# Patient Record
Sex: Female | Born: 1950 | Race: White | Hispanic: No | Marital: Married | Smoking: Never smoker
Health system: Southern US, Community
[De-identification: ages and names within clinical notes are randomized; demographics above are authoritative.]

---

## 2011-08-18 ENCOUNTER — Other Ambulatory Visit: Payer: Self-pay | Admitting: Family Medicine

## 2011-08-18 DIAGNOSIS — Z1231 Encounter for screening mammogram for malignant neoplasm of breast: Secondary | ICD-10-CM

## 2011-09-28 ENCOUNTER — Ambulatory Visit
Admission: RE | Admit: 2011-09-28 | Discharge: 2011-09-28 | Disposition: A | Discharge status: Home / Self Care | Payer: BC Managed Care – PPO | Source: Ambulatory Visit | Attending: Family Medicine | Admitting: Family Medicine

## 2011-09-28 DIAGNOSIS — Z1231 Encounter for screening mammogram for malignant neoplasm of breast: Secondary | ICD-10-CM

## 2012-12-28 ENCOUNTER — Other Ambulatory Visit: Payer: Self-pay

## 2012-12-28 DIAGNOSIS — Z1231 Encounter for screening mammogram for malignant neoplasm of breast: Secondary | ICD-10-CM

## 2013-01-26 ENCOUNTER — Ambulatory Visit
Admission: RE | Admit: 2013-01-26 | Discharge: 2013-01-26 | Disposition: A | Discharge status: Home / Self Care | Payer: BC Managed Care – PPO | Source: Ambulatory Visit

## 2013-01-26 DIAGNOSIS — Z1231 Encounter for screening mammogram for malignant neoplasm of breast: Secondary | ICD-10-CM

## 2014-01-22 ENCOUNTER — Other Ambulatory Visit: Payer: Self-pay

## 2014-01-22 DIAGNOSIS — Z1239 Encounter for other screening for malignant neoplasm of breast: Secondary | ICD-10-CM

## 2014-01-29 ENCOUNTER — Other Ambulatory Visit: Payer: Self-pay

## 2014-01-29 DIAGNOSIS — Z1231 Encounter for screening mammogram for malignant neoplasm of breast: Secondary | ICD-10-CM

## 2014-01-29 DIAGNOSIS — Z1239 Encounter for other screening for malignant neoplasm of breast: Secondary | ICD-10-CM

## 2014-01-30 ENCOUNTER — Ambulatory Visit
Admission: RE | Admit: 2014-01-30 | Discharge: 2014-01-30 | Disposition: A | Discharge status: Home / Self Care | Payer: BC Managed Care – PPO | Source: Ambulatory Visit

## 2014-01-30 DIAGNOSIS — Z1239 Encounter for other screening for malignant neoplasm of breast: Secondary | ICD-10-CM

## 2014-01-30 DIAGNOSIS — Z1231 Encounter for screening mammogram for malignant neoplasm of breast: Secondary | ICD-10-CM

## 2014-02-01 ENCOUNTER — Ambulatory Visit: Payer: BC Managed Care – PPO

## 2014-02-04 ENCOUNTER — Other Ambulatory Visit: Payer: Self-pay | Admitting: Family Medicine

## 2014-02-04 DIAGNOSIS — R928 Other abnormal and inconclusive findings on diagnostic imaging of breast: Secondary | ICD-10-CM

## 2014-02-06 ENCOUNTER — Ambulatory Visit
Admission: RE | Admit: 2014-02-06 | Discharge: 2014-02-06 | Disposition: A | Discharge status: Home / Self Care | Payer: BC Managed Care – PPO | Source: Ambulatory Visit | Attending: Family Medicine | Admitting: Family Medicine

## 2014-02-06 DIAGNOSIS — R928 Other abnormal and inconclusive findings on diagnostic imaging of breast: Secondary | ICD-10-CM

## 2014-06-05 ENCOUNTER — Other Ambulatory Visit: Payer: Self-pay | Admitting: Family Medicine

## 2014-06-05 ENCOUNTER — Ambulatory Visit
Admission: RE | Admit: 2014-06-05 | Discharge: 2014-06-05 | Disposition: A | Discharge status: Home / Self Care | Payer: BLUE CROSS/BLUE SHIELD | Source: Ambulatory Visit | Attending: Family Medicine | Admitting: Family Medicine

## 2014-06-05 DIAGNOSIS — R05 Cough: Secondary | ICD-10-CM

## 2014-06-05 DIAGNOSIS — R059 Cough, unspecified: Secondary | ICD-10-CM

## 2015-01-21 ENCOUNTER — Other Ambulatory Visit: Payer: Self-pay

## 2015-01-21 DIAGNOSIS — Z1231 Encounter for screening mammogram for malignant neoplasm of breast: Secondary | ICD-10-CM

## 2015-02-21 ENCOUNTER — Ambulatory Visit: Payer: BLUE CROSS/BLUE SHIELD

## 2015-04-18 ENCOUNTER — Ambulatory Visit
Admission: RE | Admit: 2015-04-18 | Discharge: 2015-04-18 | Disposition: A | Discharge status: Home / Self Care | Payer: BLUE CROSS/BLUE SHIELD | Source: Ambulatory Visit

## 2015-04-18 DIAGNOSIS — Z1231 Encounter for screening mammogram for malignant neoplasm of breast: Secondary | ICD-10-CM

## 2015-05-02 ENCOUNTER — Ambulatory Visit: Payer: BLUE CROSS/BLUE SHIELD

## 2015-05-02 ENCOUNTER — Encounter: Payer: Self-pay | Admitting: Podiatry

## 2015-05-02 ENCOUNTER — Ambulatory Visit (INDEPENDENT_AMBULATORY_CARE_PROVIDER_SITE_OTHER): Payer: BLUE CROSS/BLUE SHIELD

## 2015-05-02 ENCOUNTER — Ambulatory Visit (INDEPENDENT_AMBULATORY_CARE_PROVIDER_SITE_OTHER): Payer: BLUE CROSS/BLUE SHIELD | Admitting: Podiatry

## 2015-05-02 VITALS — BP 133/87 | HR 85 | Resp 16 | Ht 60.0 in | Wt 140.0 lb

## 2015-05-02 DIAGNOSIS — M21619 Bunion of unspecified foot: Secondary | ICD-10-CM

## 2015-05-02 DIAGNOSIS — Q6622 Congenital metatarsus adductus: Secondary | ICD-10-CM | POA: Diagnosis not present

## 2015-05-02 DIAGNOSIS — Q66229 Congenital metatarsus adductus, unspecified foot: Secondary | ICD-10-CM

## 2015-05-02 DIAGNOSIS — M2040 Other hammer toe(s) (acquired), unspecified foot: Secondary | ICD-10-CM

## 2015-05-02 NOTE — Progress Notes (Signed)
Subjective:     Patient ID: Haley Cabrera, female   DOB: 1950/07/12, 65 y.o.   MRN: [id number]  HPI patient states I have severe forefoot derangement right and I've had my bunion fixed on the left 8 years ago and it's done fine and the second and third toes on the right are also giving me problems. I like something done but I need to do something where I can hopefully remain weightbearing   Review of Systems  All other systems reviewed and are negative.      Objective:   Physical Exam  Constitutional: She is oriented to person, place, and time.  Cardiovascular: Intact distal pulses.   Musculoskeletal: Normal range of motion.  Neurological: She is oriented to person, place, and time.  Skin: Skin is warm.  Nursing note and vitals reviewed.  neurovascular status found to be intact muscle strength adequate range of motion within normal limits with patient noted to have significant forefoot derangement with large hyperostosis medial aspect first metatarsal head right lateral deviation of the hallux second and third toes and keratotic lesion on top of the second and third toe right with redness and pain. Good digital perfusion and well oriented 3     Assessment:     Severe forefoot derangement right with history of structural bunion correction left and hammertoe repair third left    Plan:     H&P and x-rays of both feet reviewed. At this point I discussed different surgical procedures that could be considered and I have recommended due to the fact she needs to stay weightbearing and Austin-type osteotomy and digital fusion digits 23 right even though this will not correct underlying metatarsus adductus condition. Patient is willing to accept this and understands will not give her complete correction and she'll reappoint for consult in his tenably scheduled for surgery in February. Spent a great of time reviewing what would be necessary

## 2015-05-02 NOTE — Progress Notes (Signed)
   Subjective:    Patient ID: Haley Cabrera, female    DOB: Sep 06, 1950, 65 y.o.   MRN: [id number]  HPI Patient presents with foot pain in their Right foot; bunion. Pt wants to discuss surgery; pt stated, "Had a bunionectomy in [place] on left foot about 65 yrs old".   Review of Systems  All other systems reviewed and are negative.      Objective:   Physical Exam        Assessment & Plan:

## 2015-05-21 ENCOUNTER — Ambulatory Visit (INDEPENDENT_AMBULATORY_CARE_PROVIDER_SITE_OTHER): Payer: BLUE CROSS/BLUE SHIELD | Admitting: Podiatry

## 2015-05-21 DIAGNOSIS — M21619 Bunion of unspecified foot: Secondary | ICD-10-CM | POA: Diagnosis not present

## 2015-05-21 DIAGNOSIS — M2040 Other hammer toe(s) (acquired), unspecified foot: Secondary | ICD-10-CM

## 2015-05-21 NOTE — Patient Instructions (Signed)
Pre-Operative Instructions  Congratulations, you have decided to take an important step to improving your quality of life.  You can be assured that the doctors of [facility] will be with you every step of the way.  1. Plan to be at the surgery center/hospital at least 1 (one) hour prior to your scheduled time unless otherwise directed by the surgical center/hospital staff.  You must have a responsible adult accompany you, remain during the surgery and drive you home.  Make sure you have directions to the surgical center/hospital and know how to get there on time. 2. For hospital based surgery you will need to obtain a history and physical form from your family physician within 1 month prior to the date of surgery- we will give you a form for you primary physician.  3. We make every effort to accommodate the date you request for surgery.  There are however, times where surgery dates or times have to be moved.  We will contact you as soon as possible if a change in schedule is required.   4. No Aspirin/Ibuprofen for one week before surgery.  If you are on aspirin, any non-steroidal anti-inflammatory medications (Mobic, Aleve, Ibuprofen) you should stop taking it 7 days prior to your surgery.  You make take Tylenol  For pain prior to surgery.  5. Medications- If you are taking daily heart and blood pressure medications, seizure, reflux, allergy, asthma, anxiety, pain or diabetes medications, make sure the surgery center/hospital is aware before the day of surgery so they may notify you which medications to take or avoid the day of surgery. 6. No food or drink after midnight the night before surgery unless directed otherwise by surgical center/hospital staff. 7. No alcoholic beverages 24 hours prior to surgery.  No smoking 24 hours prior to or 24 hours after surgery. 8. Wear loose pants or shorts- loose enough to fit over bandages, boots, and casts. 9. No slip on shoes, sneakers are best. 10. Bring  your boot with you to the surgery center/hospital.  Also bring crutches or a walker if your physician has prescribed it for you.  If you do not have this equipment, it will be provided for you after surgery. 11. If you have not been contracted by the surgery center/hospital by the day before your surgery, call to confirm the date and time of your surgery. 12. Leave-time from work may vary depending on the type of surgery you have.  Appropriate arrangements should be made prior to surgery with your employer. 13. Prescriptions will be provided immediately following surgery by your doctor.  Have these filled as soon as possible after surgery and take the medication as directed. 14. Remove nail polish on the operative foot. 15. Wash the night before surgery.  The night before surgery wash the foot and leg well with the antibacterial soap provided and water paying special attention to beneath the toenails and in between the toes.  Rinse thoroughly with water and dry well with a towel.  Perform this wash unless told not to do so by your physician.  Enclosed: 1 Ice pack (please put in freezer the night before surgery)   1 Hibiclens skin cleaner   Pre-op Instructions  If you have any questions regarding the instructions, do not hesitate to call our office.  [place]: [address] [place], [place] [address] [phone]  [place]: [address] [place], [place] [address] [phone]  [place]: [address]  [place],  [address] [phone]  Dr. Richard   Tuchman DPM, Dr. Norman Regal DPM Dr. Richard Sikora DPM, Dr. M. Todd Hyatt DPM, Dr. Kathryn Egerton DPM 

## 2015-05-21 NOTE — Progress Notes (Signed)
Subjective:     Patient ID: Haley Cabrera, female   DOB: 07-17-1950, 65 y.o.   MRN: [id number]  HPI patient presents for consult concerning correction of the right foot and states that she understands there is no guarantees as far as success of procedures. Review of Systems     Objective:   Physical Exam Neurovascular status intact negative Homans sign noted with good digital perfusion noted. 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 We discussed the fact there is also metatarsus adductus deformity and we will not be deemed this portion of the component and she is completely comfortable with this understanding that she may not achieve complete clinical correction or radiographic correction. Understands adductus deformity does make the correction more difficult and that there is no long-term guarantees as far as cosmetic result. Patient is instant getting out of pain and I do think this will achieve that and she needs to be weightbearing due to her job status. This time I allowed her to read consent form reviewing alternative treatments complications as listed and after extensive review she signed understanding recovery is approximate 6 months. She is dispensed air fracture walker with instructions on usage and all preoperative instructions and will be seen back to recheck

## 2015-05-27 ENCOUNTER — Encounter: Payer: Self-pay | Admitting: Podiatry

## 2015-05-27 DIAGNOSIS — M2041 Other hammer toe(s) (acquired), right foot: Secondary | ICD-10-CM | POA: Diagnosis not present

## 2015-05-27 DIAGNOSIS — M2011 Hallux valgus (acquired), right foot: Secondary | ICD-10-CM | POA: Diagnosis not present

## 2015-06-05 ENCOUNTER — Ambulatory Visit (INDEPENDENT_AMBULATORY_CARE_PROVIDER_SITE_OTHER): Payer: BLUE CROSS/BLUE SHIELD | Admitting: Podiatry

## 2015-06-05 ENCOUNTER — Ambulatory Visit (INDEPENDENT_AMBULATORY_CARE_PROVIDER_SITE_OTHER): Payer: BLUE CROSS/BLUE SHIELD

## 2015-06-05 ENCOUNTER — Encounter: Payer: Self-pay | Admitting: Podiatry

## 2015-06-05 VITALS — BP 136/89 | HR 77 | Resp 16

## 2015-06-05 DIAGNOSIS — M21619 Bunion of unspecified foot: Secondary | ICD-10-CM

## 2015-06-05 DIAGNOSIS — M2040 Other hammer toe(s) (acquired), unspecified foot: Secondary | ICD-10-CM

## 2015-06-05 DIAGNOSIS — Z9889 Other specified postprocedural states: Secondary | ICD-10-CM

## 2015-06-05 NOTE — Progress Notes (Signed)
Subjective:     Patient ID: Haley Cabrera, female   DOB: 1950-09-24, 65 y.o.   MRN: [id number]  HPI patient states my foot has felt sore but I'm doing okay overall with not a lot of swelling and I did not take very much pain medicine as it bothers me   Review of Systems     Objective:   Physical Exam Neurovascular status intact muscle strength adequate range of motion within normal limits with patient found to have well coapted site second and third digits right with pins in place and first metatarsal right with some shortening noted of the hallux clinically area negative Homans sign was noted    Assessment:     Healing from forefoot surgery right    Plan:     X-ray taken reviewed. There has been unfortunately some movement of the big toe in a medial and dorsal direction indicating a probable tear of the lateral capsule allowing for instability. She may have been on it some or it may have been a spontaneous event but no matter what it's going to have to be repositioned. I reviewed this case with Dr. Al Corpus and reviewed x-rays and we are   both in agreement that the hallux can be re-positioned over top the first metatarsal with pin fixation and that it's possible that we will need to realign the head itself. I allowed her to read a consent form for this and she is scheduled for surgery in the next week to have this realigned. Understands that it may not necessarily to this position and that the possibility does exist that it could require fusion at one point in the future  X-ray report indicated that there is dorsal and medial movement of the right hallux and possible movement of the first metatarsal head itself with the second and third toes been excellent position and pins in place

## 2015-06-10 ENCOUNTER — Encounter: Payer: Self-pay | Admitting: Podiatry

## 2015-06-10 DIAGNOSIS — S92301S Fracture of unspecified metatarsal bone(s), right foot, sequela: Secondary | ICD-10-CM | POA: Diagnosis not present

## 2015-06-18 ENCOUNTER — Ambulatory Visit (INDEPENDENT_AMBULATORY_CARE_PROVIDER_SITE_OTHER): Payer: BLUE CROSS/BLUE SHIELD

## 2015-06-18 ENCOUNTER — Ambulatory Visit (INDEPENDENT_AMBULATORY_CARE_PROVIDER_SITE_OTHER): Payer: BLUE CROSS/BLUE SHIELD | Admitting: Podiatry

## 2015-06-18 VITALS — Temp 98.5°F

## 2015-06-18 DIAGNOSIS — Z9889 Other specified postprocedural states: Secondary | ICD-10-CM

## 2015-06-18 DIAGNOSIS — M2040 Other hammer toe(s) (acquired), unspecified foot: Secondary | ICD-10-CM | POA: Diagnosis not present

## 2015-06-18 DIAGNOSIS — M21619 Bunion of unspecified foot: Secondary | ICD-10-CM | POA: Diagnosis not present

## 2015-06-19 NOTE — Progress Notes (Signed)
Subjective:     Patient ID: Haley Cabrera, female   DOB: [birth year], 65 y.o.   MRN: [id number]  HPI patient states she's doing very well with her right foot and is having minimal discomfort   Review of Systems     Objective:   Physical Exam Vascular status intact muscle strength adequate with pin in place right big toe second and third toe with excellent alignment of the right big toe noted currently    Assessment:     Doing well post repair of abnormal bone position right first metatarsal head    Plan:     X-rays reviewed and continue immobilization at the current time. Reappoint in 2 weeks for pin removal second and third digits and the big toe will be in place for approximate 3 weeks  X-ray report indicate excellent alignment of the hallux second and third toes with the osteotomy healing well and in good position with joint congruence

## 2015-06-30 ENCOUNTER — Encounter: Payer: Self-pay | Admitting: Podiatry

## 2015-06-30 ENCOUNTER — Ambulatory Visit (INDEPENDENT_AMBULATORY_CARE_PROVIDER_SITE_OTHER): Payer: BLUE CROSS/BLUE SHIELD | Admitting: Podiatry

## 2015-06-30 ENCOUNTER — Ambulatory Visit (INDEPENDENT_AMBULATORY_CARE_PROVIDER_SITE_OTHER): Payer: BLUE CROSS/BLUE SHIELD

## 2015-06-30 VITALS — BP 124/88 | HR 98 | Resp 16

## 2015-06-30 DIAGNOSIS — M2040 Other hammer toe(s) (acquired), unspecified foot: Secondary | ICD-10-CM

## 2015-06-30 DIAGNOSIS — M21619 Bunion of unspecified foot: Secondary | ICD-10-CM

## 2015-06-30 DIAGNOSIS — Z9889 Other specified postprocedural states: Secondary | ICD-10-CM | POA: Diagnosis not present

## 2015-07-01 ENCOUNTER — Encounter: Payer: Self-pay | Admitting: Podiatry

## 2015-07-01 NOTE — Progress Notes (Unsigned)
Patient ID: Boone MasterJoanne Malburg, female   DOB: 02/24/1951, 65 y.o.   MRN: [id number] Dr. Charlsie Merlesegal performed a Eliberto IvoryAustin Bunionectomy(cutting  + maring bone) with pin fixation right foot, Hammertoe repair 2,3 right foot (with pin fixation) on 05/27/2015 [facility].

## 2015-07-01 NOTE — Progress Notes (Signed)
Subjective:     Patient ID: Haley Cabrera, female   DOB: [birth year], 65 y.o.   MRN: [id number]  HPI patient presents for removal of pins from the second and third toes right stating she's doing very well   Review of Systems     Objective:   Physical Exam Neurovascular status intact with patient having pins in place right hallux second and third digits with good alignment and stability and minimal edema with wound edges well coapted    Assessment:     Doing well post surgery right foot    Plan:     Pins removed from second and third toes with sterile dressings applied and reviewed x-rays. Instructed on physical therapy and gradual increase in activity and reappoint 2 weeks for pin removal right first metatarsal  X-rays indicating good alignment of the hallux second and third toes with the osteotomy the first metatarsal healing very well and pin in place and the first metatarsal

## 2015-07-03 ENCOUNTER — Ambulatory Visit (INDEPENDENT_AMBULATORY_CARE_PROVIDER_SITE_OTHER): Payer: BLUE CROSS/BLUE SHIELD | Admitting: Podiatry

## 2015-07-03 ENCOUNTER — Telehealth: Payer: Self-pay | Admitting: *Deleted

## 2015-07-03 ENCOUNTER — Ambulatory Visit (INDEPENDENT_AMBULATORY_CARE_PROVIDER_SITE_OTHER): Payer: BLUE CROSS/BLUE SHIELD

## 2015-07-03 ENCOUNTER — Other Ambulatory Visit: Payer: Self-pay | Admitting: *Deleted

## 2015-07-03 DIAGNOSIS — Z9889 Other specified postprocedural states: Secondary | ICD-10-CM

## 2015-07-03 DIAGNOSIS — M21619 Bunion of unspecified foot: Secondary | ICD-10-CM

## 2015-07-03 MED ORDER — HYDROCODONE-ACETAMINOPHEN 5-325 MG PO TABS: 1.0000 | ORAL_TABLET | Freq: Four times a day (QID) | ORAL | Status: DC | PRN

## 2015-07-03 MED ORDER — CEPHALEXIN 500 MG PO CAPS: 500.0000 mg | ORAL_CAPSULE | Freq: Three times a day (TID) | ORAL | Status: DC

## 2015-07-03 NOTE — Telephone Encounter (Signed)
Pt states since removing the pins from her toes she has had more pain, and has had an incident where the remaining pin has scratched the other foot.  Pt request refill of Vicodin. Pt denies redness, swelling or drainage from surgical foot. Dr. Charlsie Merlesegal ordered Vicodin 5/325 #30 one tablet every 6 hours prn foot pain.  Orders to pt and she will pick up in the [place] office.

## 2015-07-04 ENCOUNTER — Other Ambulatory Visit: Payer: BLUE CROSS/BLUE SHIELD

## 2015-07-04 NOTE — Progress Notes (Signed)
Subjective:     Patient ID: Haley Cabrera Cabrera, female   DOB: [birth year], 65 y.o.   MRN: [id number]  HPI patient states she traumatized her right big toe joint right and it started to bother her and she can't her left foot with the edges of the pin and she just wanted to make sure bone was okay. Has slight redness on top but that's not really causing her problems currently   Review of Systems     Objective:   Physical Exam Neurovascular status intact with no proximal edema erythema drainage noted with very mild redness around the first MPJ right but she did traumatized the joint with mild swelling noted    Assessment:     Probable edema of the right forefoot with no current indications of infection except for mild redness    Plan:     X-ray reviewed with patient and his precautionary measure placed her on cephalexin 500 mg 3 times a day. Gave strict instructions of any changes were to occur any problems to let us know immediately and she is scheduled next week for pin removal first metatarsal  X-ray report indicated pin is in place no indications of motion with good structural alignment noted

## 2015-07-11 ENCOUNTER — Ambulatory Visit (INDEPENDENT_AMBULATORY_CARE_PROVIDER_SITE_OTHER): Payer: BLUE CROSS/BLUE SHIELD

## 2015-07-11 ENCOUNTER — Ambulatory Visit (INDEPENDENT_AMBULATORY_CARE_PROVIDER_SITE_OTHER): Payer: BLUE CROSS/BLUE SHIELD | Admitting: Podiatry

## 2015-07-11 DIAGNOSIS — M21619 Bunion of unspecified foot: Secondary | ICD-10-CM | POA: Diagnosis not present

## 2015-07-11 DIAGNOSIS — M2040 Other hammer toe(s) (acquired), unspecified foot: Secondary | ICD-10-CM

## 2015-07-11 DIAGNOSIS — Z9889 Other specified postprocedural states: Secondary | ICD-10-CM | POA: Diagnosis not present

## 2015-07-11 NOTE — Progress Notes (Signed)
Subjective:     Patient ID: Boone MasterJoanne Landau, female   DOB: December 23, 1950, 65 y.o.   MRN: [id number]  HPI patient states the pain came out on its own in the right foot and I'm really feeling pretty good and having only mild discomfort   Review of Systems     Objective:   Physical Exam Neurovascular status intact muscle strength adequate with patient found to have pain it's come out of the first metatarsal right with good alignment noted and good range of motion with approximate 1520 dorsiflexion 1520 plantarflexion with flexor tendon function which appears to be normal at this time    Assessment:     Doing well post fracture the first metatarsal right fixated    Plan:     X-ray reviewed and encouraged range of motion first MPJ and gradual return to soft shoe gear in the next week or 2. Patient will continue with elevation compression and will be seen back in 4 weeks  X-ray report indicated the osteotomy looks real good and everything is healing well with the possibility that the pin that is in place may need to be removed some day in the future

## 2015-07-14 ENCOUNTER — Other Ambulatory Visit: Payer: BLUE CROSS/BLUE SHIELD

## 2015-07-28 NOTE — Progress Notes (Signed)
[facility] for [facility] Treatment Metatarsal 1x right foot.  Prescription written for Vicodin 10/325, quantity of 30.

## 2015-07-29 NOTE — Telephone Encounter (Signed)
Entered in error

## 2015-08-08 ENCOUNTER — Other Ambulatory Visit: Payer: BLUE CROSS/BLUE SHIELD

## 2015-08-11 ENCOUNTER — Encounter: Payer: Self-pay | Admitting: Podiatry

## 2015-08-11 ENCOUNTER — Ambulatory Visit (INDEPENDENT_AMBULATORY_CARE_PROVIDER_SITE_OTHER): Payer: BLUE CROSS/BLUE SHIELD | Admitting: Podiatry

## 2015-08-11 ENCOUNTER — Ambulatory Visit (INDEPENDENT_AMBULATORY_CARE_PROVIDER_SITE_OTHER): Payer: BLUE CROSS/BLUE SHIELD

## 2015-08-11 ENCOUNTER — Telehealth: Payer: Self-pay | Admitting: *Deleted

## 2015-08-11 VITALS — BP 165/105 | HR 91 | Temp 98.6°F | Resp 16

## 2015-08-11 DIAGNOSIS — Z9889 Other specified postprocedural states: Secondary | ICD-10-CM

## 2015-08-11 NOTE — Telephone Encounter (Signed)
Pt states over the weekend she and her husband were out of town, and her surgical foot blew up and became red, and she wanted a call back.  Levan HurstM. Nunnally informed me she had spoke to pt and she has an appt for this afternoon at 400pm but still requested a call back. I left message informing pt I was aware she had an appt today and that was the best course of action for her symptoms, to call again if further concerns.

## 2015-08-14 NOTE — Progress Notes (Signed)
Subjective:     Patient ID: Haley Cabrera, female   DOB: March 26, 1951, 65 y.o.   MRN: [id number]  HPI patient presents stating I was just concerned because I had some redness in my foot over the weekend and I took an antibiotic and it's better now   Review of Systems     Objective:   Physical Exam Neurovascular status intact muscle strength adequate with minimal redness in the right dorsal foot with incision site healing well on the first metatarsal with slight varus component to the hallux and good range of motion. The incision site is completely healed with no drainage    Assessment:     Difficult to tell what the problem may have been but at this time no indication of infection    Plan:     Took x-rays and advised her to finish antibiotics and if any issues were to occur patient is to let us know immediately. Also we're getting over bandages her toe in a valgus position to make sure it does not go into any further varus component  X-ray indicated the osteotomy is healed well with the pin that may possibly need to come out which I explained to her and the joint is congruous and in a slightly straight position

## 2015-08-22 ENCOUNTER — Ambulatory Visit (INDEPENDENT_AMBULATORY_CARE_PROVIDER_SITE_OTHER): Payer: BLUE CROSS/BLUE SHIELD | Admitting: Podiatry

## 2015-08-22 DIAGNOSIS — M21619 Bunion of unspecified foot: Secondary | ICD-10-CM

## 2015-08-22 DIAGNOSIS — Z9889 Other specified postprocedural states: Secondary | ICD-10-CM

## 2015-08-22 DIAGNOSIS — Q66229 Congenital metatarsus adductus, unspecified foot: Secondary | ICD-10-CM

## 2015-08-22 DIAGNOSIS — Q6622 Congenital metatarsus adductus: Secondary | ICD-10-CM

## 2015-08-22 NOTE — Progress Notes (Signed)
Subjective:     Patient ID: Haley Cabrera, female   DOB: Sep 11, 1950, 65 y.o.   MRN: [id number]  HPI patient states that she's feeling good with her right foot   Review of Systems     Objective:   Physical Exam Neurovascular status intact with patient feeling good with the right surgery with good structural alignment no swelling or pain noted currently    Assessment:     Doing well post forefoot reconstruction right    Plan:     X-rays reviewed and advised on the importance of range of motion exercises and supportive shoe gear. Reappoint for us to recheck again in 4 weeks or earlier if needed

## 2015-10-03 ENCOUNTER — Encounter: Payer: Self-pay | Admitting: Podiatry

## 2015-10-03 ENCOUNTER — Ambulatory Visit (INDEPENDENT_AMBULATORY_CARE_PROVIDER_SITE_OTHER): Payer: BLUE CROSS/BLUE SHIELD

## 2015-10-03 ENCOUNTER — Ambulatory Visit (INDEPENDENT_AMBULATORY_CARE_PROVIDER_SITE_OTHER): Payer: BLUE CROSS/BLUE SHIELD | Admitting: Podiatry

## 2015-10-03 VITALS — BP 153/85 | HR 87 | Resp 16

## 2015-10-03 DIAGNOSIS — M21619 Bunion of unspecified foot: Secondary | ICD-10-CM

## 2015-10-03 DIAGNOSIS — Z9889 Other specified postprocedural states: Secondary | ICD-10-CM

## 2015-10-05 NOTE — Progress Notes (Signed)
Subjective:     Patient ID: Haley Cabrera, female   DOB: [birth year], 65 y.o.   MRN: [id number]  HPI patient is found to have excellent healing of the right foot and states that she gets occasional swelling towards the end of the day or when she sitting in a car   Review of Systems     Objective:   Physical Exam Neurovascular status intact negative Homans sign noted and well coapted incision site right hallux second and third digit    Assessment:     Doing well post surgical intervention right foot    Plan:     Advised on anti-inflammatories and continued range of motion and continued taping of the hallux in a lateral direction  X-ray report indicates that the hallux is congruous but there is a gap between the hallux and second toe mostly due to the deformities of the lesser digits pin is in place and excellent healing appears to be occurring

## 2015-10-28 IMAGING — CR DG CHEST 2V
2 series · 2 of 2 positions shown · non-contrast
Comparison: None.

CLINICAL DATA: Cough, upper respiratory symptoms.

EXAM:
CHEST  2 VIEW

[w chest pa]
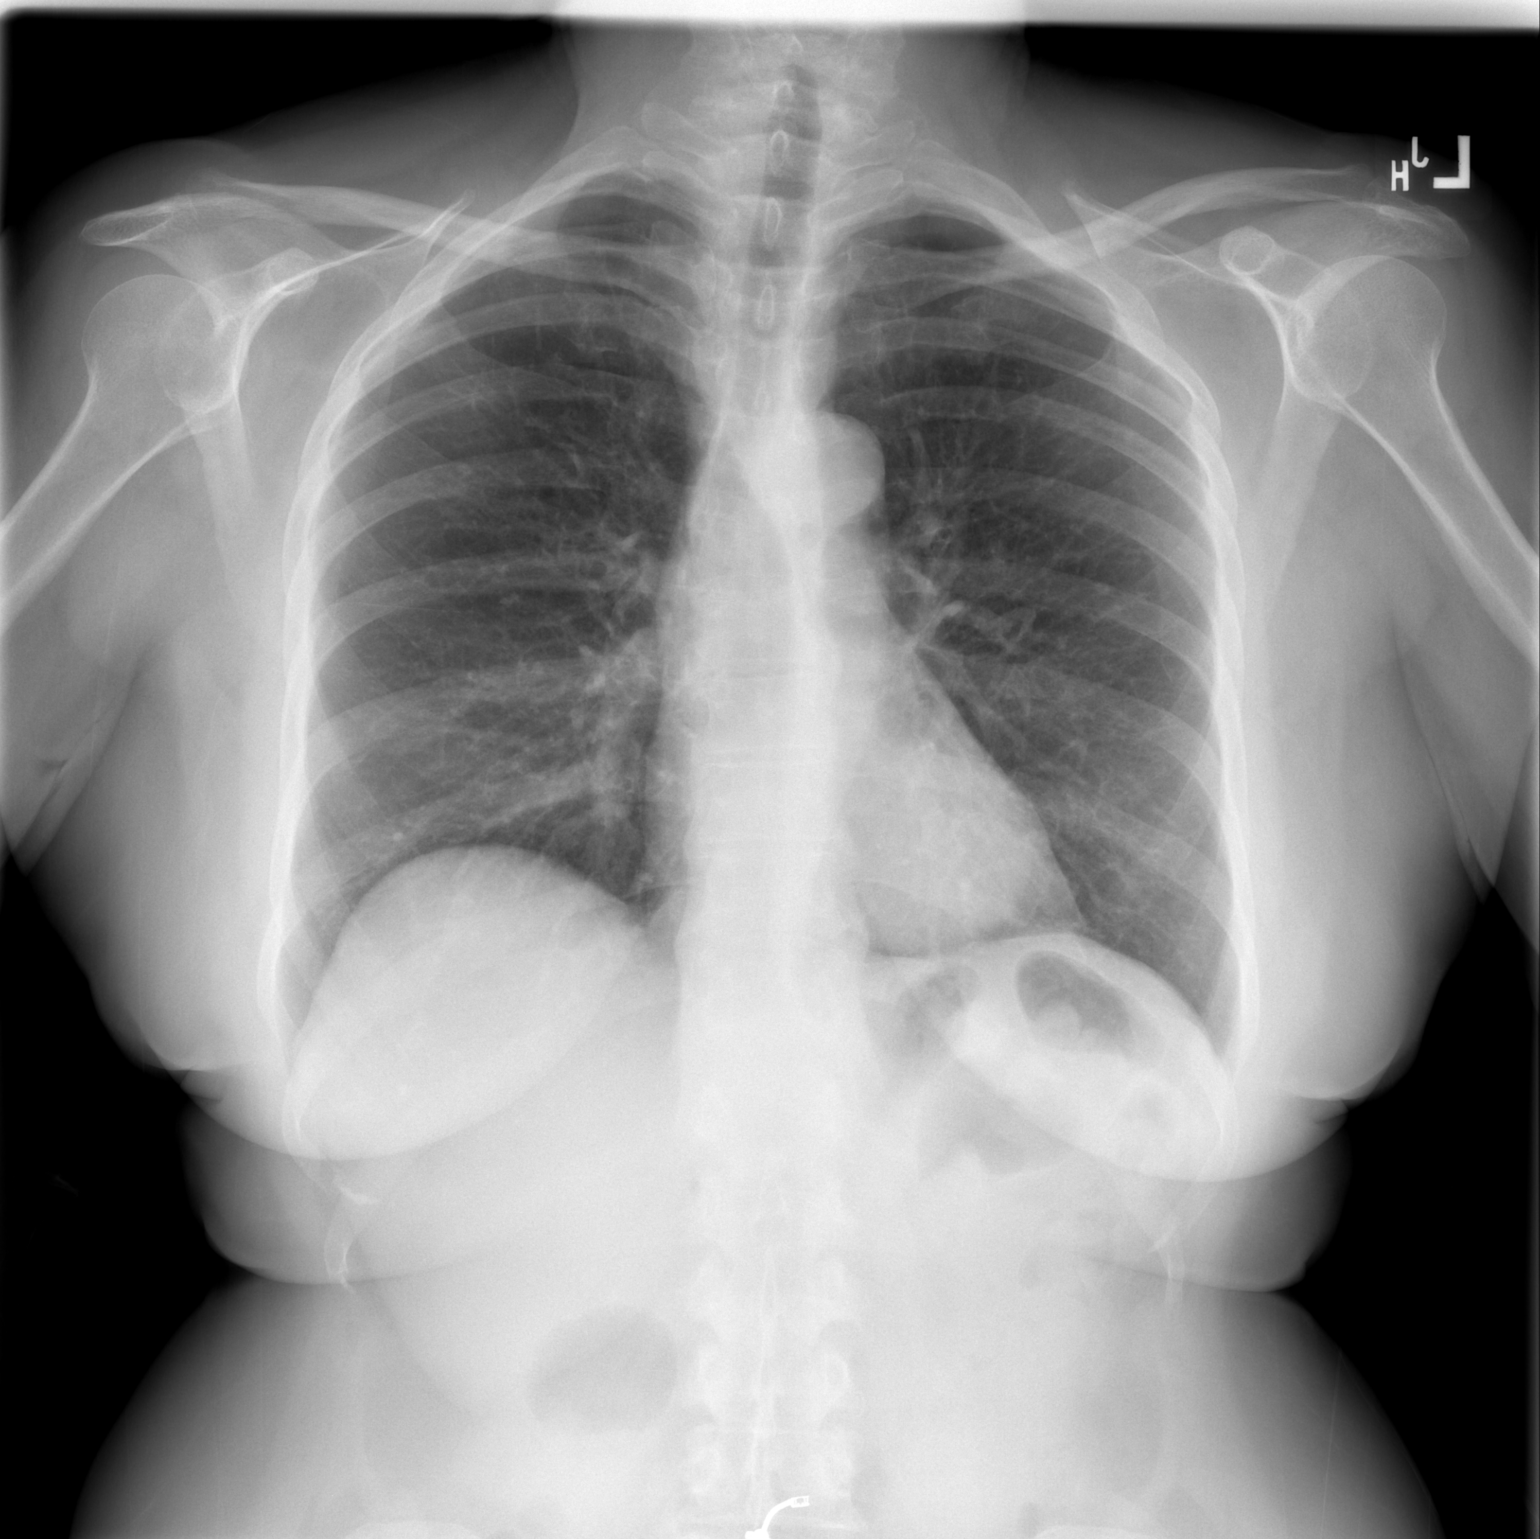

[w chest lat]
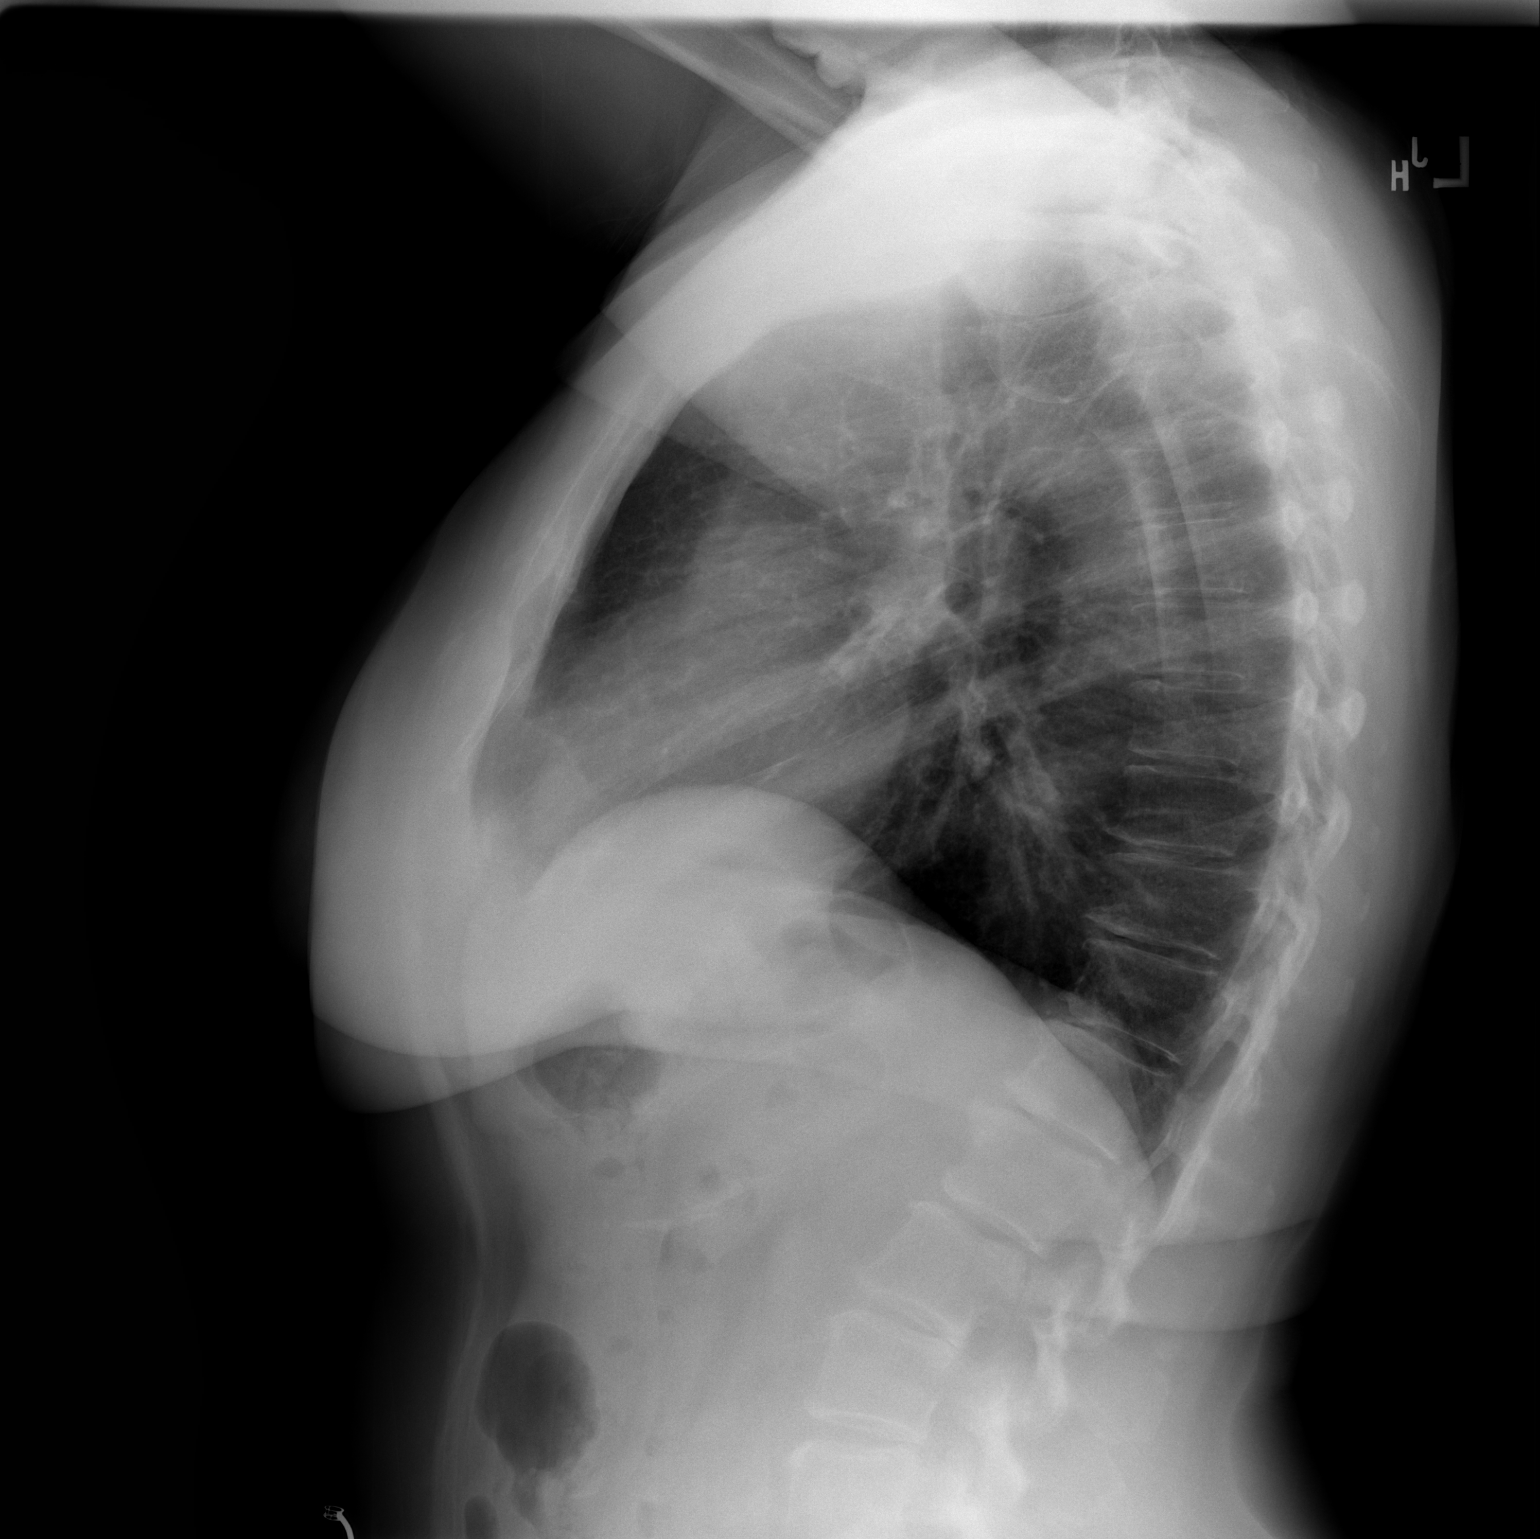

[2 of 2 positions shown; findings below may reference images not displayed]

FINDINGS: Normal mediastinum and cardiac silhouette. Normal pulmonary
vasculature. No evidence of effusion, infiltrate, or pneumothorax.
No acute bony abnormality. Degenerative osteophytosis of the
thoracic spine.
IMPRESSION: No acute cardiopulmonary process.

## 2015-11-21 ENCOUNTER — Ambulatory Visit (INDEPENDENT_AMBULATORY_CARE_PROVIDER_SITE_OTHER): Payer: BLUE CROSS/BLUE SHIELD

## 2015-11-21 ENCOUNTER — Ambulatory Visit (INDEPENDENT_AMBULATORY_CARE_PROVIDER_SITE_OTHER): Payer: BLUE CROSS/BLUE SHIELD | Admitting: Podiatry

## 2015-11-21 DIAGNOSIS — Z9889 Other specified postprocedural states: Secondary | ICD-10-CM

## 2015-11-21 DIAGNOSIS — M21619 Bunion of unspecified foot: Secondary | ICD-10-CM

## 2015-11-23 NOTE — Progress Notes (Signed)
Subjective:     Patient ID: Haley Cabrera, female   DOB: [birth year], 65 y.o.   MRN: [id number]  HPI patient states she is continuing to improve with her right foot but has some sharp pain in the bottom of the right foot   Review of Systems     Objective:   Physical Exam Neurovascular status intact muscle strength adequate with patien. I noted there to be a pin from distal to proximal but is long and is probably irritating skin t having a well structured first MPJ right with wound edges well coapted and hallux in rectus position    Assessment:     Doing well post osteotomy right first metatarsal with slight lateral deviation of the lesser digits. Abnormal pin position right    Plan:     Reviewed continued valgus position of the right hallux with no indications currently of varus deformity and she is discharged and let she needs me for. At this point I allowed her to read consent form for pin removal explaining to her all possible complications and also alternative treatments and she wants pin removed which will be done in the office and we answered all questions to best of our ability       X-ray report indicated that there is good alignment of the first MPJ but I did note that the pin is too low and we'll need to be removed

## 2015-11-26 ENCOUNTER — Ambulatory Visit (INDEPENDENT_AMBULATORY_CARE_PROVIDER_SITE_OTHER): Payer: BLUE CROSS/BLUE SHIELD

## 2015-11-26 ENCOUNTER — Encounter: Payer: Self-pay | Admitting: Podiatry

## 2015-11-26 ENCOUNTER — Ambulatory Visit (INDEPENDENT_AMBULATORY_CARE_PROVIDER_SITE_OTHER): Payer: BLUE CROSS/BLUE SHIELD | Admitting: Podiatry

## 2015-11-26 VITALS — BP 140/81 | HR 79 | Temp 96.3°F | Resp 16

## 2015-11-26 DIAGNOSIS — Z9889 Other specified postprocedural states: Secondary | ICD-10-CM | POA: Diagnosis not present

## 2015-11-26 DIAGNOSIS — M21169 Varus deformity, not elsewhere classified, unspecified knee: Secondary | ICD-10-CM

## 2015-11-26 NOTE — Progress Notes (Signed)
Subjective:     Patient ID: Haley Cabrera, female   DOB: 1950-08-29, 65 y.o.   MRN: [id number]  HPI patient presents for removal of pin from the dorsum of the right foot that had been placed and was slightly long and creating possible irritation   Review of Systems     Objective:   Physical Exam Neurovascular status intact muscle strength adequate with elongation of pin first metatarsal right from a distal direction    Assessment:     Elongated pin right    Plan:     Patient was brought to the OR place a supine position the OR table. The patient was injected with 60 Milligan's like Marcaine mixture and under sterile conditions the tourniquet was inflated to her 50 mmHg. A small incision was made over the offending area and utilizing sharp and blunt dissection was taken to bone. The pin was identified removed from all surrounding soft tissue attachments and was removed entirely. The wound was flushed with copious [place] compression solution and the skin was sutured with 5-0 nylon. Sterile dressing was applied tourniquet released capillary fill mute to all digits and patient was instructed on elevation compression

## 2015-12-12 ENCOUNTER — Encounter: Payer: Self-pay | Admitting: Podiatry

## 2015-12-26 ENCOUNTER — Ambulatory Visit (INDEPENDENT_AMBULATORY_CARE_PROVIDER_SITE_OTHER): Payer: BLUE CROSS/BLUE SHIELD

## 2015-12-26 ENCOUNTER — Ambulatory Visit (INDEPENDENT_AMBULATORY_CARE_PROVIDER_SITE_OTHER): Payer: BLUE CROSS/BLUE SHIELD | Admitting: Podiatry

## 2015-12-26 DIAGNOSIS — M21619 Bunion of unspecified foot: Secondary | ICD-10-CM

## 2015-12-26 DIAGNOSIS — Z9889 Other specified postprocedural states: Secondary | ICD-10-CM

## 2015-12-26 DIAGNOSIS — Z472 Encounter for removal of internal fixation device: Secondary | ICD-10-CM

## 2015-12-27 NOTE — Progress Notes (Signed)
This patient presents to the office four weeks after surgery on her right foot.  She had removal of K-wire performed on 8/16 by Dr. Charlsie Merlesegal.  She previously has surgery for correction of her bunion and toes right foot. She says she has been doing well with her right foot except there is pain second toe right foot. She presents to the office for post operative visit.   Objective  Neurovascular status intact.  Healing noted at surgical site right foot.  Good ROM 1st MPJ  Right foot.  Healing of skin at K-wire removal site.  Pain second toe right foot at the site of the surgery. No redness or swelling noted right foot.  All incisions healed.   Diagnosis  S/p foot surgery right  Treatment.  ROV  Xrays reveal healing progressing in bunion.  There is need for additional healing at the PIPJ second toe right foot.  Told her to try buddy taping her second toe.  RTC 4 weeks for evaluation by Dr. Charlsie Merlesegal.   Helane GuntherGregory Thelonious Kauffmann DPM

## 2016-04-23 ENCOUNTER — Other Ambulatory Visit: Payer: Self-pay | Admitting: Family Medicine

## 2016-04-23 DIAGNOSIS — Z1231 Encounter for screening mammogram for malignant neoplasm of breast: Secondary | ICD-10-CM

## 2016-06-04 ENCOUNTER — Ambulatory Visit
Admission: RE | Admit: 2016-06-04 | Discharge: 2016-06-04 | Disposition: A | Discharge status: Home / Self Care | Payer: BLUE CROSS/BLUE SHIELD | Source: Ambulatory Visit | Attending: Family Medicine | Admitting: Family Medicine

## 2016-06-04 DIAGNOSIS — Z1231 Encounter for screening mammogram for malignant neoplasm of breast: Secondary | ICD-10-CM

## 2017-06-23 ENCOUNTER — Other Ambulatory Visit: Payer: Self-pay | Admitting: Family Medicine

## 2017-06-23 DIAGNOSIS — Z1231 Encounter for screening mammogram for malignant neoplasm of breast: Secondary | ICD-10-CM

## 2017-07-14 ENCOUNTER — Ambulatory Visit
Admission: RE | Admit: 2017-07-14 | Discharge: 2017-07-14 | Disposition: A | Discharge status: Home / Self Care | Payer: BLUE CROSS/BLUE SHIELD | Source: Ambulatory Visit | Attending: Family Medicine | Admitting: Family Medicine

## 2017-07-14 DIAGNOSIS — Z1231 Encounter for screening mammogram for malignant neoplasm of breast: Secondary | ICD-10-CM

## 2018-04-13 DIAGNOSIS — M3500 Sicca syndrome, unspecified: Secondary | ICD-10-CM | POA: Diagnosis not present

## 2018-04-13 DIAGNOSIS — R5381 Other malaise: Secondary | ICD-10-CM | POA: Diagnosis not present

## 2018-04-25 DIAGNOSIS — F329 Major depressive disorder, single episode, unspecified: Secondary | ICD-10-CM | POA: Diagnosis not present

## 2018-04-25 DIAGNOSIS — Z1211 Encounter for screening for malignant neoplasm of colon: Secondary | ICD-10-CM | POA: Diagnosis not present

## 2018-04-25 DIAGNOSIS — I10 Essential (primary) hypertension: Secondary | ICD-10-CM | POA: Diagnosis not present

## 2018-04-25 DIAGNOSIS — R5383 Other fatigue: Secondary | ICD-10-CM | POA: Diagnosis not present

## 2018-10-19 ENCOUNTER — Ambulatory Visit: Payer: BC Managed Care – PPO | Admitting: Psychiatry

## 2018-10-23 ENCOUNTER — Ambulatory Visit: Payer: BC Managed Care – PPO | Admitting: Physician Assistant

## 2018-10-24 DIAGNOSIS — F411 Generalized anxiety disorder: Secondary | ICD-10-CM | POA: Insufficient documentation

## 2018-10-24 DIAGNOSIS — F330 Major depressive disorder, recurrent, mild: Secondary | ICD-10-CM | POA: Insufficient documentation

## 2018-11-28 ENCOUNTER — Ambulatory Visit: Payer: BC Managed Care – PPO | Admitting: Psychiatry

## 2018-12-13 DIAGNOSIS — F451 Undifferentiated somatoform disorder: Secondary | ICD-10-CM | POA: Insufficient documentation

## 2019-05-04 ENCOUNTER — Ambulatory Visit (INDEPENDENT_AMBULATORY_CARE_PROVIDER_SITE_OTHER): Payer: Medicare Other | Admitting: Cardiology

## 2019-05-04 ENCOUNTER — Encounter: Payer: Self-pay | Admitting: Cardiology

## 2019-05-04 ENCOUNTER — Other Ambulatory Visit: Payer: Self-pay

## 2019-05-04 VITALS — BP 152/102 | HR 113 | Ht 60.0 in | Wt 145.0 lb

## 2019-05-04 DIAGNOSIS — I10 Essential (primary) hypertension: Secondary | ICD-10-CM | POA: Insufficient documentation

## 2019-05-04 DIAGNOSIS — R011 Cardiac murmur, unspecified: Secondary | ICD-10-CM

## 2019-05-04 DIAGNOSIS — Z1329 Encounter for screening for other suspected endocrine disorder: Secondary | ICD-10-CM | POA: Diagnosis not present

## 2019-05-04 MED ORDER — DILTIAZEM HCL ER COATED BEADS 240 MG PO CP24: 240.0000 mg | ORAL_CAPSULE | Freq: Every day | ORAL | 3 refills | Status: DC

## 2019-05-04 NOTE — Progress Notes (Signed)
Cardiology Office Note:    Date:  05/04/2019   ID:  Haley Cabrera, DOB May 15, 1950, MRN [id number]  PCP:  Farris Has, MD  Cardiologist:  Garwin Brothers, MD   Referring MD: Farris Has, MD    ASSESSMENT:    1. Essential hypertension   2. Cardiac murmur    PLAN:    In order of problems listed above:  1. Essential hypertension: Primary prevention stressed with the patient.  Importance of compliance with diet and medication stressed and she vocalized understanding.  Her blood pressure is elevated.  Diet and lifestyle modification and salt intake issues were discussed.  In view of tachycardia I will switch her medications from losartan to Cardizem CD to 40 mg daily.  Benefits and potential is explained and she vocalized understanding.  She is going to check with the pharmacist to make sure her medications are fine with Cardizem and without any significant intervention.  Also for her palpitations I will get a TSH. 2. Cardiac murmur, echocardiogram will be done to assess murmur on auscultation 3. Follow-up appointment in a month or earlier if she has any concerns.  Patient had multiple questions which were answered to her satisfaction.  Importance of regular exercise stressed once her heart rate got under better control.   Medication Adjustments/Labs and Tests Ordered: Current medicines are reviewed at length with the patient today.  Concerns regarding medicines are outlined above.  No orders of the defined types were placed in this encounter.  No orders of the defined types were placed in this encounter.    History of Present Illness:    Haley Cabrera is a 69 y.o. female who is being seen today for the evaluation of tachycardia palpitations and hypertension at the request of Farris Has, MD.  Patient is a pleasant 69 year old female.  She has past medical history of essential hypertension.  She has been noticing to be tachycardic.  She is an anxious lady.  She has history of  depression and is on treatment for the same.  She gives history of Sjogren's disease.  At the time of my evaluation, the patient is alert awake oriented and in no distress.  She denies any chest pain.  She is an active lady.  History reviewed. No pertinent past medical history.  History reviewed. No pertinent surgical history.  Current Medications: Current Meds  Medication Sig  . clonazePAM (KLONOPIN) 0.5 MG tablet Take 0.5-1.5 mg by mouth daily as needed.  Marland Kitchen losartan (COZAAR) 100 MG tablet Take 100 mg by mouth daily.  . traZODone (DESYREL) 50 MG tablet Take 50 mg by mouth daily.      Allergies:   Percocet [oxycodone-acetaminophen]   Social History   Socioeconomic History  . Marital status: Married    Spouse name: Not on file  . Number of children: Not on file  . Years of education: Not on file  . Highest education level: Not on file  Occupational History  . Not on file  Tobacco Use  . Smoking status: Never Smoker  Substance and Sexual Activity  . Alcohol use: Not on file  . Drug use: Not on file  . Sexual activity: Not on file  Other Topics Concern  . Not on file  Social History Narrative  . Not on file   Social Determinants of Health   Financial Resource Strain:   . Difficulty of Paying Living Expenses: Not on file  Food Insecurity:   . Worried About Programme researcher, broadcasting/film/video in the  Last Year: Not on file  . Ran Out of Food in the Last Year: Not on file  Transportation Needs:   . Lack of Transportation (Medical): Not on file  . Lack of Transportation (Non-Medical): Not on file  Physical Activity:   . Days of Exercise per Week: Not on file  . Minutes of Exercise per Session: Not on file  Stress:   . Feeling of Stress : Not on file  Social Connections:   . Frequency of Communication with Friends and Family: Not on file  . Frequency of Social Gatherings with Friends and Family: Not on file  . Attends Religious Services: Not on file  . Active Member of Clubs or  Organizations: Not on file  . Attends Archivist Meetings: Not on file  . Marital Status: Not on file     Family History: The patient's family history is negative for Breast cancer.  ROS:   Please see the history of present illness.    All other systems reviewed and are negative.  EKGs/Labs/Other Studies Reviewed:    The following studies were reviewed today: EKG reveals sinus tachycardia and nonspecific ST-T changes   Recent Labs: No results found for requested labs within last 8760 hours.  Recent Lipid Panel No results found for: CHOL, TRIG, HDL, CHOLHDL, VLDL, LDLCALC, LDLDIRECT  Physical Exam:    VS:  BP (!) 152/102   Pulse (!) 113   Ht 5' (1.524 m)   Wt 145 lb (65.8 kg)   SpO2 97%   BMI 28.32 kg/m     Wt Readings from Last 3 Encounters:  05/04/19 145 lb (65.8 kg)  05/02/15 140 lb (63.5 kg)     GEN: Patient is in no acute distress HEENT: Normal NECK: No JVD; No carotid bruits LYMPHATICS: No lymphadenopathy CARDIAC: S1 S2 regular, 2/6 systolic murmur at the apex. RESPIRATORY:  Clear to auscultation without rales, wheezing or rhonchi  ABDOMEN: Soft, non-tender, non-distended MUSCULOSKELETAL:  No edema; No deformity  SKIN: Warm and dry NEUROLOGIC:  Alert and oriented x 3 PSYCHIATRIC:  Normal affect    Signed, Jenean Lindau, MD  05/04/2019 2:35 PM    [facility]

## 2019-05-04 NOTE — Patient Instructions (Addendum)
Medication Instructions:  Your physician has recommended you make the following change in your medication:   STOP taking losartan  START taking Cardizem CD 240 (1 capsule) once daily  *If you need a refill on your cardiac medications before your next appointment, please call your pharmacy*  Lab Work: Your physician recommends that you have a TSH drawn today  If you have labs (blood work) drawn today and your tests are completely normal, you will receive your results only by: Marland Kitchen MyChart Message (if you have MyChart) OR . A paper copy in the mail If you have any lab test that is abnormal or we need to change your treatment, we will call you to review the results.  Testing/Procedures: You had an EKG performed today  Your physician has requested that you have an echocardiogram. Echocardiography is a painless test that uses sound waves to create images of your heart. It provides your doctor with information about the size and shape of your heart and how well your heart's chambers and valves are working. This procedure takes approximately one hour. There are no restrictions for this procedure.    Follow-Up: At [facility], you and your health needs are our priority.  As part of our continuing mission to provide you with exceptional heart care, we have created designated Provider Care Teams.  These Care Teams include your primary Cardiologist (physician) and Advanced Practice Providers (APPs -  Physician Assistants and Nurse Practitioners) who all work together to provide you with the care you need, when you need it.  Your next appointment:   1 month(s)  The format for your next appointment:   In Person  Provider:   Belva Crome, MD  Other Instructions Diltiazem Extended-Release Oral Capsules or Tablets What is this medicine? DILTIAZEM (dil TYE a zem) is a calcium channel blocker. It relaxes your blood vessels and decreases the amount of work the heart has to do. It treats high  blood pressure and/or prevents chest pain (also called angina). This medicine may be used for other purposes; ask your health care provider or pharmacist if you have questions. COMMON BRAND NAME(S): Cardizem CD, Cardizem LA, Cardizem SR, Cartia XT, Dilacor XR, Dilt-CD, Diltia XT, Diltzac, Matzim LA, Verna Czech, TIADYLT ER, Tiamate, Tiazac What should I tell my health care provider before I take this medicine? They need to know if you have any of these conditions:  heart attack  heart disease  irregular heartbeat or rhythm  low blood pressure  an unusual or allergic reaction to diltiazem, other drugs, foods, dyes, or preservatives  pregnant or trying to get pregnant  breast-feeding How should I use this medicine? Take this drug by mouth. Take it as directed on the prescription label at the same time every day. Do not cut, crush or chew this drug. Swallow the capsules whole. You can take it with or without food. If it upsets your stomach, take it with food. Keep taking it unless your health care provider tells you to stop. Talk to your health care provider about the use of this drug in children. Special care may be needed. Overdosage: If you think you have taken too much of this medicine contact a poison control center or emergency room at once. NOTE: This medicine is only for you. Do not share this medicine with others. What if I miss a dose? If you miss a dose, take it as soon as you can. If it is almost time for your next dose, take only that  dose. Do not take double or extra doses. What may interact with this medicine? Do not take this medicine with any of the following medications:  cisapride  hawthorn  pimozide  ranolazine  red yeast rice This medicine may also interact with the following medications:  buspirone  carbamazepine  cimetidine  cyclosporine  digoxin  local anesthetics or general anesthetics  lovastatin  medicines for anxiety or difficulty sleeping  like midazolam and triazolam  medicines for high blood pressure or heart problems  quinidine  rifampin, rifabutin, or rifapentine This list may not describe all possible interactions. Give your health care provider a list of all the medicines, herbs, non-prescription drugs, or dietary supplements you use. Also tell them if you smoke, drink alcohol, or use illegal drugs. Some items may interact with your medicine. What should I watch for while using this medicine? Visit your health care provider for regular checks on your progress. Check your blood pressure as directed. Ask your health care provider what your blood pressure should be. Also, find out when you should contact him or her. Do not treat yourself for coughs, colds, or pain while you are using this drug without asking your health care provider for advice. Some drugs may increase your blood pressure. This drug may cause serious skin reactions. They can happen weeks to months after starting the drug. Contact your health care provider right away if you notice fevers or flu-like symptoms with a rash. The rash may be red or purple and then turn into blisters or peeling of the skin. Or, you might notice a red rash with swelling of the face, lips or lymph nodes in your neck or under your arms. You may get drowsy or dizzy. Do not drive, use machinery, or do anything that needs mental alertness until you know how this drug affects you. Do not stand up or sit up quickly, especially if you are an older patient. This reduces the risk of dizzy or fainting spells. What side effects may I notice from receiving this medicine? Side effects that you should report to your doctor or health care provider as soon as possible:  allergic reactions (skin rash, itching or hives; swelling of the face, lips, or tongue)  heart failure (trouble breathing; fast, irregular heartbeat; sudden weight gain; swelling of the ankles, feet, hands; unusually weak or tired)   heartbeat rhythm changes (trouble breathing; chest pain; dizziness; fast, irregular heartbeat; feeling faint or lightheaded, falls)  liver injury (dark yellow or brown urine; general ill feeling or flu-like symptoms; loss of appetite, right upper belly pain; unusually weak or tired, yellowing of the eyes or skin)  redness, blistering, peeling, or loosening of the skin, including inside the mouth Side effects that usually do not require medical attention (report to your doctor or health care provider if they continue or are bothersome):  changes in sex drive or performance  changes in vision  cough  depressed mood  headache  nasal congestion (like runny or stuffy nose)  sudden weight gain  trouble sleeping This list may not describe all possible side effects. Call your doctor for medical advice about side effects. You may report side effects to FDA at 1-800-FDA-1088. Where should I keep my medicine? Keep out of the reach of children and pets. Store at room temperature between 20 and 25 degrees C (68 and 77 degrees F). Protect from moisture. Keep the container tightly closed. Throw away any unused drug after the expiration date. NOTE: This sheet is a summary.  It may not cover all possible information. If you have questions about this medicine, talk to your doctor, pharmacist, or health care provider.  2020 Elsevier/Gold Standard (2018-12-19 14:48:13)  Echocardiogram An echocardiogram is a procedure that uses painless sound waves (ultrasound) to produce an image of the heart. Images from an echocardiogram can provide important information about:  Signs of coronary artery disease (CAD).  Aneurysm detection. An aneurysm is a weak or damaged part of an artery wall that bulges out from the normal force of blood pumping through the body.  Heart size and shape. Changes in the size or shape of the heart can be associated with certain conditions, including heart failure, aneurysm, and CAD.   Heart muscle function.  Heart valve function.  Signs of a past heart attack.  Fluid buildup around the heart.  Thickening of the heart muscle.  A tumor or infectious growth around the heart valves. Tell a health care provider about:  Any allergies you have.  All medicines you are taking, including vitamins, herbs, eye drops, creams, and over-the-counter medicines.  Any blood disorders you have.  Any surgeries you have had.  Any medical conditions you have.  Whether you are pregnant or may be pregnant. What are the risks? Generally, this is a safe procedure. However, problems may occur, including:  Allergic reaction to dye (contrast) that may be used during the procedure. What happens before the procedure? No specific preparation is needed. You may eat and drink normally. What happens during the procedure?   An IV tube may be inserted into one of your veins.  You may receive contrast through this tube. A contrast is an injection that improves the quality of the pictures from your heart.  A gel will be applied to your chest.  A wand-like tool (transducer) will be moved over your chest. The gel will help to transmit the sound waves from the transducer.  The sound waves will harmlessly bounce off of your heart to allow the heart images to be captured in real-time motion. The images will be recorded on a computer. The procedure may vary among health care providers and hospitals. What happens after the procedure?  You may return to your normal, everyday life, including diet, activities, and medicines, unless your health care provider tells you not to do that. Summary  An echocardiogram is a procedure that uses painless sound waves (ultrasound) to produce an image of the heart.  Images from an echocardiogram can provide important information about the size and shape of your heart, heart muscle function, heart valve function, and fluid buildup around your heart.  You do  not need to do anything to prepare before this procedure. You may eat and drink normally.  After the echocardiogram is completed, you may return to your normal, everyday life, unless your health care provider tells you not to do that. This information is not intended to replace advice given to you by your health care provider. Make sure you discuss any questions you have with your health care provider. Document Revised: 07/20/2018 Document Reviewed: 05/01/2016 Elsevier Patient Education  2020 [employer].

## 2019-05-05 LAB — TSH: TSH: 2.37 u[IU]/mL (ref 0.450–4.500)

## 2019-05-08 ENCOUNTER — Ambulatory Visit (HOSPITAL_BASED_OUTPATIENT_CLINIC_OR_DEPARTMENT_OTHER)
Admission: RE | Admit: 2019-05-08 | Discharge: 2019-05-08 | Disposition: A | Discharge status: Home / Self Care | Payer: Medicare Other | Source: Ambulatory Visit | Attending: Cardiology | Admitting: Cardiology

## 2019-05-08 ENCOUNTER — Other Ambulatory Visit: Payer: Self-pay

## 2019-05-08 DIAGNOSIS — R011 Cardiac murmur, unspecified: Secondary | ICD-10-CM | POA: Diagnosis present

## 2019-05-08 DIAGNOSIS — I10 Essential (primary) hypertension: Secondary | ICD-10-CM | POA: Diagnosis present

## 2019-05-08 NOTE — Progress Notes (Signed)
  Echocardiogram 2D Echocardiogram has been performed.  Sinda Du 05/08/2019, 1:49 PM

## 2019-05-10 ENCOUNTER — Telehealth: Payer: Self-pay

## 2019-05-10 NOTE — Telephone Encounter (Signed)
Results for echo and labs discussed. Copy sent to Dr. Kateri Plummer

## 2019-05-10 NOTE — Telephone Encounter (Signed)
-----   Message from Garwin Brothers, MD sent at 05/09/2019 12:10 PM EST ----- The results of the study is unremarkable. Please inform patient. I will discuss in detail at next appointment. Cc  primary care/referring physician Garwin Brothers, MD 05/09/2019 12:10 PM

## 2019-05-15 ENCOUNTER — Other Ambulatory Visit: Payer: Self-pay | Admitting: Physician Assistant

## 2019-05-15 ENCOUNTER — Telehealth: Payer: Self-pay | Admitting: Physician Assistant

## 2019-05-15 NOTE — Telephone Encounter (Signed)
Pt recently started on Cardizem CD 240 mg qd and has developed dizziness, wonders if it is the med.   Currently, she feels so weak, she cannot lift her head off the bed without getting extremely dizzy. She has fallen 3 times.   Of note, she was taken off Buspar last week and changed to Lamictal. She has only had a few doses. She is no longer taking Cymbalta, has been off that for over a month.   She was recently diagnosed with Sjogren's syndrome.   She took her BP a few times while she was feeling bad. It was 130/91, with a HR in the 80s-90s.   I checked for drug interactions between Cardizem and Lamictal, did not find any.   I explained that her BP/HR were not low enough to cause her sx.   Explained that she could get some dizziness if her BP was decreasing > 60 pts systolic after taking the medication, but she does not believe it is doing that.   Suggested she talk to the prescriber of the Lamictal to see if this med is causing the sx.   Also suggested she talk to her PCP, as the sx sound like vertigo.   Suggested she take the Cardizem at night and the Lamictal in the morning to see if spreading the meds apart helps the sx. She was unable to take the Lamictal at night, it caused insomnia.   Theodore Demark, PA-C 05/15/2019 6:06 PM

## 2019-05-27 ENCOUNTER — Ambulatory Visit: Payer: Medicare Other

## 2019-05-28 ENCOUNTER — Ambulatory Visit (INDEPENDENT_AMBULATORY_CARE_PROVIDER_SITE_OTHER): Payer: Medicare Other

## 2019-05-28 ENCOUNTER — Encounter: Payer: Self-pay | Admitting: Cardiology

## 2019-05-28 ENCOUNTER — Other Ambulatory Visit: Payer: Self-pay

## 2019-05-28 ENCOUNTER — Ambulatory Visit (INDEPENDENT_AMBULATORY_CARE_PROVIDER_SITE_OTHER): Payer: Medicare Other | Admitting: Cardiology

## 2019-05-28 VITALS — BP 146/100 | HR 106 | Ht 60.0 in | Wt 140.0 lb

## 2019-05-28 DIAGNOSIS — M3500 Sicca syndrome, unspecified: Secondary | ICD-10-CM | POA: Insufficient documentation

## 2019-05-28 DIAGNOSIS — R000 Tachycardia, unspecified: Secondary | ICD-10-CM | POA: Diagnosis not present

## 2019-05-28 DIAGNOSIS — R002 Palpitations: Secondary | ICD-10-CM | POA: Insufficient documentation

## 2019-05-28 DIAGNOSIS — I10 Essential (primary) hypertension: Secondary | ICD-10-CM

## 2019-05-28 NOTE — Progress Notes (Signed)
Cardiology Office Note:    Date:  05/28/2019   ID:  Haley Cabrera, DOB Aug 21, 1950, MRN [id number]  PCP:  Farris Has, MD  Cardiologist:  Garwin Brothers, MD   Referring MD: Farris Has, MD    ASSESSMENT:    1. Essential hypertension   2. Sinus tachycardia   3. Palpitations    PLAN:    In order of problems listed above:  1. Primary prevention stressed with the patient.  Importance of compliance with diet and medication stressed and she vocalized understanding 2. Palpitations: Her TSH is fine.  Continues to have elevated heart rate and palpitations even with Cardizem CD at the dose recommended.  We will do a 2-week ZIO monitoring. 3. I discussed findings of the echocardiogram with her at extensive length.  She is moving to [place] in the next couple of weeks and wants to get an appointment with her cardiologist down there.  We will get in touch with her about the reports of the monitoring.  She will be seen in follow-up appointment on a as needed basis only.  She had multiple questions which were answered to her satisfaction.   Medication Adjustments/Labs and Tests Ordered: Current medicines are reviewed at length with the patient today.  Concerns regarding medicines are outlined above.  No orders of the defined types were placed in this encounter.  No orders of the defined types were placed in this encounter.    Chief Complaint  Patient presents with  . Follow-up    1 Month     History of Present Illness:    Haley Cabrera is a 69 y.o. female.  Patient has history of Sjogren's syndrome and palpitations.  She denies any problems at this time except that she is depressed and fatigued all the time.  No chest pain orthopnea or PND.  She tells me that she continues to get palpitations.  At the time of my evaluation, the patient is alert awake oriented and in no distress.  History reviewed. No pertinent past medical history.  History reviewed. No pertinent surgical  history.  Current Medications: Current Meds  Medication Sig  . clonazePAM (KLONOPIN) 0.5 MG tablet Take 0.5-1.5 mg by mouth daily as needed.  . diltiazem (CARDIZEM CD) 240 MG 24 hr capsule Take 1 capsule (240 mg total) by mouth daily.  Marland Kitchen lamoTRIgine (LAMICTAL) 25 MG tablet 50 mg.   . traZODone (DESYREL) 100 MG tablet Take by mouth daily.      Allergies:   Percocet [oxycodone-acetaminophen]   Social History   Socioeconomic History  . Marital status: Married    Spouse name: Not on file  . Number of children: Not on file  . Years of education: Not on file  . Highest education level: Not on file  Occupational History  . Not on file  Tobacco Use  . Smoking status: Never Smoker  . Smokeless tobacco: Never Used  Substance and Sexual Activity  . Alcohol use: Not Currently  . Drug use: Not on file  . Sexual activity: Not on file  Other Topics Concern  . Not on file  Social History Narrative  . Not on file   Social Determinants of Health   Financial Resource Strain:   . Difficulty of Paying Living Expenses: Not on file  Food Insecurity:   . Worried About Programme researcher, broadcasting/film/video in the Last Year: Not on file  . Ran Out of Food in the Last Year: Not on file  Transportation Needs:   .  Lack of Transportation (Medical): Not on file  . Lack of Transportation (Non-Medical): Not on file  Physical Activity:   . Days of Exercise per Week: Not on file  . Minutes of Exercise per Session: Not on file  Stress:   . Feeling of Stress : Not on file  Social Connections:   . Frequency of Communication with Friends and Family: Not on file  . Frequency of Social Gatherings with Friends and Family: Not on file  . Attends Religious Services: Not on file  . Active Member of Clubs or Organizations: Not on file  . Attends Archivist Meetings: Not on file  . Marital Status: Not on file     Family History: The patient's family history is negative for Breast cancer.  ROS:   Please see  the history of present illness.    All other systems reviewed and are negative.  EKGs/Labs/Other Studies Reviewed:    The following studies were reviewed today:  IMPRESSIONS    1. Left ventricular ejection fraction, by visual estimation, is 60 to  65%. There is moderately increased left ventricular hypertrophy.  2. Left ventricular diastolic parameters are consistent with Grade I  diastolic dysfunction (impaired relaxation).  3. Left atrial size was normal.  4. The mitral valve is normal in structure. Trivial mitral valve  regurgitation.  5. The tricuspid valve is normal in structure. Tricuspid valve  regurgitation is trivial.  6. The aortic valve is normal in structure. Aortic valve regurgitation is  not visualized.  7. The pulmonic valve was normal in structure. Pulmonic valve  regurgitation is trivial.   Recent Labs: 05/04/2019: TSH 2.370  Recent Lipid Panel No results found for: CHOL, TRIG, HDL, CHOLHDL, VLDL, LDLCALC, LDLDIRECT  Physical Exam:    VS:  BP (!) 146/100   Pulse (!) 106   Ht 5' (1.524 m)   Wt 140 lb (63.5 kg)   SpO2 98%   BMI 27.34 kg/m     Wt Readings from Last 3 Encounters:  05/28/19 140 lb (63.5 kg)  05/04/19 145 lb (65.8 kg)  05/02/15 140 lb (63.5 kg)     GEN: Patient is in no acute distress HEENT: Normal NECK: No JVD; No carotid bruits LYMPHATICS: No lymphadenopathy CARDIAC: Hear sounds regular, 2/6 systolic murmur at the apex. RESPIRATORY:  Clear to auscultation without rales, wheezing or rhonchi  ABDOMEN: Soft, non-tender, non-distended MUSCULOSKELETAL:  No edema; No deformity  SKIN: Warm and dry NEUROLOGIC:  Alert and oriented x 3 PSYCHIATRIC:  Normal affect   Signed, Jenean Lindau, MD  05/28/2019 2:55 PM    [place]

## 2019-05-28 NOTE — Addendum Note (Signed)
Addended by: Eleonore Chiquito on: 05/28/2019 03:18 PM   Modules accepted: Orders

## 2019-05-28 NOTE — Patient Instructions (Addendum)
Medication Instructions:  No medication changes *If you need a refill on your cardiac medications before your next appointment, please call your pharmacy*  Lab Work: None ordered If you have labs (blood work) drawn today and your tests are completely normal, you will receive your results only by: Marland Kitchen MyChart Message (if you have MyChart) OR . A paper copy in the mail If you have any lab test that is abnormal or we need to change your treatment, we will call you to review the results.  Testing/Procedures: Your physician has recommended that you wear a Zio monitor. Zio monitors are medical devices that record the heart's electrical activity. Doctors most often [place] these monitors to diagnose arrhythmias. Arrhythmias are problems with the speed or rhythm of the heartbeat. The monitor is a small, portable device. You can wear one while you do your normal daily activities. This is usually used to diagnose what is causing palpitations/syncope (passing out). Wear the monitor for 14 days    Follow-Up: At [facility], you and your health needs are our priority.  As part of our continuing mission to provide you with exceptional heart care, we have created designated Provider Care Teams.  These Care Teams include your primary Cardiologist (physician) and Advanced Practice Providers (APPs -  Physician Assistants and Nurse Practitioners) who all work together to provide you with the care you need, when you need it.    Enjoy your new home in [place] with warmer weather!

## 2019-06-26 ENCOUNTER — Other Ambulatory Visit: Payer: Self-pay

## 2019-06-26 MED ORDER — DILTIAZEM HCL ER COATED BEADS 240 MG PO CP24: 240.0000 mg | ORAL_CAPSULE | Freq: Every day | ORAL | 3 refills | Status: AC

## 2023-05-17 NOTE — Progress Notes (Signed)
 This encounter was created in error - please disregard.
